# Patient Record
Sex: Female | Born: 1940 | Race: Black or African American | Hispanic: No | Marital: Single | Smoking: Never smoker
Health system: Southern US, Community
[De-identification: ages and names within clinical notes are randomized; demographics above are authoritative.]

## PROBLEM LIST (undated history)

## (undated) DIAGNOSIS — I10 Essential (primary) hypertension: Secondary | ICD-10-CM

## (undated) HISTORY — PX: OTHER SURGICAL HISTORY: SHX169

---

## 2019-11-28 ENCOUNTER — Ambulatory Visit: Payer: Self-pay | Attending: Internal Medicine

## 2019-11-28 DIAGNOSIS — Z20822 Contact with and (suspected) exposure to covid-19: Secondary | ICD-10-CM | POA: Insufficient documentation

## 2019-11-29 LAB — NOVEL CORONAVIRUS, NAA: SARS-CoV-2, NAA: NOT DETECTED

## 2021-10-21 ENCOUNTER — Other Ambulatory Visit: Payer: Self-pay

## 2021-10-21 ENCOUNTER — Ambulatory Visit (HOSPITAL_COMMUNITY)
Admission: EM | Admit: 2021-10-21 | Discharge: 2021-10-21 | Disposition: A | Discharge status: Other Acute Inpatient Hospital | Payer: Medicare Other | Attending: Emergency Medicine | Admitting: Emergency Medicine

## 2021-10-21 ENCOUNTER — Encounter (HOSPITAL_COMMUNITY): Payer: Self-pay

## 2021-10-21 ENCOUNTER — Emergency Department (HOSPITAL_COMMUNITY)
Admission: EM | Admit: 2021-10-21 | Discharge: 2021-10-22 | Disposition: A | Discharge status: Home / Self Care | Payer: Medicare Other | Attending: Emergency Medicine | Admitting: Emergency Medicine

## 2021-10-21 ENCOUNTER — Emergency Department (HOSPITAL_COMMUNITY)
Admission: EM | Admit: 2021-10-21 | Discharge: 2021-10-21 | Discharge status: Against Medical Advice | Payer: Medicare Other

## 2021-10-21 DIAGNOSIS — R195 Other fecal abnormalities: Secondary | ICD-10-CM | POA: Diagnosis not present

## 2021-10-21 DIAGNOSIS — Z20822 Contact with and (suspected) exposure to covid-19: Secondary | ICD-10-CM | POA: Insufficient documentation

## 2021-10-21 DIAGNOSIS — R112 Nausea with vomiting, unspecified: Secondary | ICD-10-CM | POA: Insufficient documentation

## 2021-10-21 DIAGNOSIS — R109 Unspecified abdominal pain: Secondary | ICD-10-CM | POA: Insufficient documentation

## 2021-10-21 DIAGNOSIS — R000 Tachycardia, unspecified: Secondary | ICD-10-CM | POA: Diagnosis not present

## 2021-10-21 DIAGNOSIS — K920 Hematemesis: Secondary | ICD-10-CM

## 2021-10-21 DIAGNOSIS — R1084 Generalized abdominal pain: Secondary | ICD-10-CM

## 2021-10-21 DIAGNOSIS — I10 Essential (primary) hypertension: Secondary | ICD-10-CM

## 2021-10-21 LAB — COMPREHENSIVE METABOLIC PANEL
ALT: 13 U/L (ref 0–44)
AST: 15 U/L (ref 15–41)
Albumin: 4.1 g/dL (ref 3.5–5.0)
Alkaline Phosphatase: 54 U/L (ref 38–126)
Anion gap: 9 (ref 5–15)
BUN: 13 mg/dL (ref 8–23)
CO2: 27 mmol/L (ref 22–32)
Calcium: 9.7 mg/dL (ref 8.9–10.3)
Chloride: 105 mmol/L (ref 98–111)
Creatinine, Ser: 0.97 mg/dL (ref 0.44–1.00)
GFR, Estimated: 59 mL/min — ABNORMAL LOW (ref >60)
Glucose, Bld: 109 mg/dL — ABNORMAL HIGH (ref 70–99)
Potassium: 4.4 mmol/L (ref 3.5–5.1)
Sodium: 141 mmol/L (ref 135–145)
Total Bilirubin: 0.6 mg/dL (ref 0.3–1.2)
Total Protein: 7.8 g/dL (ref 6.5–8.1)

## 2021-10-21 LAB — URINALYSIS, MICROSCOPIC (REFLEX)

## 2021-10-21 LAB — URINALYSIS, ROUTINE W REFLEX MICROSCOPIC
Bilirubin Urine: NEGATIVE
Glucose, UA: NEGATIVE mg/dL
Hgb urine dipstick: NEGATIVE
Ketones, ur: NEGATIVE mg/dL
Nitrite: NEGATIVE
Protein, ur: 30 mg/dL — AB
Specific Gravity, Urine: 1.02 (ref 1.005–1.030)
pH: 8 (ref 5.0–8.0)

## 2021-10-21 LAB — CBC WITH DIFFERENTIAL/PLATELET
Abs Immature Granulocytes: 0.03 10*3/uL (ref 0.00–0.07)
Basophils Absolute: 0 10*3/uL (ref 0.0–0.1)
Basophils Relative: 0 %
Eosinophils Absolute: 0.1 10*3/uL (ref 0.0–0.5)
Eosinophils Relative: 1 %
HCT: 40.3 % (ref 36.0–46.0)
Hemoglobin: 12.8 g/dL (ref 12.0–15.0)
Immature Granulocytes: 0 %
Lymphocytes Relative: 21 %
Lymphs Abs: 1.9 10*3/uL (ref 0.7–4.0)
MCH: 28.4 pg (ref 26.0–34.0)
MCHC: 31.8 g/dL (ref 30.0–36.0)
MCV: 89.6 fL (ref 80.0–100.0)
Monocytes Absolute: 0.5 10*3/uL (ref 0.1–1.0)
Monocytes Relative: 6 %
Neutro Abs: 6.5 10*3/uL (ref 1.7–7.7)
Neutrophils Relative %: 72 %
Platelets: 275 10*3/uL (ref 150–400)
RBC: 4.5 MIL/uL (ref 3.87–5.11)
RDW: 13.3 % (ref 11.5–15.5)
WBC: 9.1 10*3/uL (ref 4.0–10.5)
nRBC: 0 % (ref 0.0–0.2)

## 2021-10-21 LAB — LIPASE, BLOOD: Lipase: 35 U/L (ref 11–51)

## 2021-10-21 MED ORDER — ONDANSETRON 4 MG PO TBDP: 4.0000 mg | ORAL_TABLET | Freq: Once | ORAL | Status: DC

## 2021-10-21 NOTE — ED Provider Notes (Signed)
HPI  SUBJECTIVE:  Angelica Cook is a 81 y.o. female who presents with 1 week of nausea, vomiting every time she eats.  She started vomiting blood last night. She reports 1 episode last night, however, is vomiting darkish brown-reddish material here.  She states that she has not been able to tolerate any p.o. since last night.  She reports headache, and diffuse abdominal burning pain.  Denies chest pain, pressure, heaviness, shortness of breath, abdominal distention.  She has never had symptoms like this before.  She has tried Tums without improvement in her symptoms.  There are no aggravating or alleviating factors.  She has a past medical history of hypertension, ran out of her medications 1 month ago, coronary disease, status post CABG in 2015.  History of MI, esophageal varices, diabetes, alcohol abuse, she is not on any anticoagulants or antiplatelets.  No past medical history on file.   No family history on file.  Social History   Tobacco Use   Smoking status: Never   Smokeless tobacco: Never  Substance Use Topics   Alcohol use: Never   Drug use: Never     Current Facility-Administered Medications:    ondansetron (ZOFRAN-ODT) disintegrating tablet 4 mg, 4 mg, Oral, Once, Domenick Gong, MD No current outpatient medications on file.  No Known Allergies   ROS  As noted in HPI.   Physical Exam  BP (!) 151/96    Pulse 99    Temp 98.4 F (36.9 C) (Oral)    Resp 16    SpO2 99%   Blood pressure 188/124 heart rate 101 on my exam  Constitutional: Well developed, well nourished, appears ill, violently vomiting brownish red material Eyes:  EOMI, conjunctiva normal bilaterally HENT: Normocephalic, atraumatic,mucus membranes moist Respiratory: Normal inspiratory effort, rales left lower lobe Cardiovascular: Regular tachycardia, no murmurs, rubs, gallop GI: nondistended, soft, positive periumbilical and right upper quadrant tenderness.  No guarding, rebound. skin: No rash,  skin intact Musculoskeletal: no deformities Neurologic: Alert & oriented x 3, no focal neuro deficits Psychiatric: Speech and behavior appropriate   ED Course   Medications  ondansetron (ZOFRAN-ODT) disintegrating tablet 4 mg (has no administration in time range)    Orders Placed This Encounter  Procedures   ED EKG    Standing Status:   Standing    Number of Occurrences:   1    Order Specific Question:   Reason for Exam    Answer:   Weakness   Insert peripheral IV    Standing Status:   Standing    Number of Occurrences:   1    No results found for this or any previous visit (from the past 24 hour(s)). No results found.  ED Clinical Impression  1. Hematemesis with nausea   2. Generalized abdominal pain   3. Hypertension, unspecified type      ED Assessment/Plan  Checking EKG.  Patient is complaining of headache, has a markedly elevated blood pressure, borderline tachycardia and hematemesis.  Transferring to the emergency department for further evaluation via EMS in case she deteriorates on the way down.  Placing IV.  Discussed rationale for transfer to the emergency department with patient and with family member.  They agree with plan.  EKG: Sinus tachycardia, rate 105.  Normal axis, normal intervals.  No hypertrophy.  No ST elevation.  No previous EKG for comparison.  Gave report to EMS.  Meds ordered this encounter  Medications   ondansetron (ZOFRAN-ODT) disintegrating tablet 4 mg      *  This clinic note was created using Scientist, clinical (histocompatibility and immunogenetics). Therefore, there may be occasional mistakes despite careful proofreading.  ?    Domenick Gong, MD 10/21/21 [phone]

## 2021-10-21 NOTE — ED Provider Notes (Signed)
Emergency Medicine Provider Triage Evaluation Note  Angelica Cook , a 81 y.o. female  was evaluated in triage.  Pt complains of Pt complains of vomiting blood.  Had onset of nausea and vomiting starting Monday.  She had multiple episodes of vomiting.  Today she noticed some vomit that was streaked with blood.  She went to the urgent care and was sent here.  She has no abdominal pain at this time.  Patient is not on any blood thinners .  Review of Systems  Positive: HEMATEMESIS Negative: ABD PAIN  Physical Exam  There were no vitals taken for this visit. Gen:   Awake, no distress   Resp:  Normal effort  MSK:   Moves extremities without difficulty  Other:  ABD NON-TENDER  Medical Decision Making  Medically screening exam initiated at 4:59 PM.  Appropriate orders placed.  Hinton Dyer was informed that the remainder of the evaluation will be completed by another provider, this initial triage assessment does not replace that evaluation, and the importance of remaining in the ED until their evaluation is complete.  Work-up initiated   Margarita Mail, PA-C 10/21/21 [place], [place], MD 10/22/21 [phone]

## 2021-10-21 NOTE — ED Triage Notes (Signed)
Patient arrived from [facility] for further evaluation of abdominal pain with nausea and vomiting. States that she had 1 episode of vomiting coffee ground emesis. On arrival alert and oriented,

## 2021-10-21 NOTE — ED Triage Notes (Signed)
Pt presents with c/o vomiting, headache and states there is blood in her vomit x 1 week.   Pt c/o heart burn.

## 2021-10-21 NOTE — ED Notes (Signed)
Pt called multiple times no answer 

## 2021-10-22 LAB — CBC
HCT: 39.3 % (ref 36.0–46.0)
Hemoglobin: 12.5 g/dL (ref 12.0–15.0)
MCH: 28.3 pg (ref 26.0–34.0)
MCHC: 31.8 g/dL (ref 30.0–36.0)
MCV: 89.1 fL (ref 80.0–100.0)
Platelets: 253 10*3/uL (ref 150–400)
RBC: 4.41 MIL/uL (ref 3.87–5.11)
RDW: 13.2 % (ref 11.5–15.5)
WBC: 7 10*3/uL (ref 4.0–10.5)
nRBC: 0 % (ref 0.0–0.2)

## 2021-10-22 LAB — RESP PANEL BY RT-PCR (FLU A&B, COVID) ARPGX2
Influenza A by PCR: NEGATIVE
Influenza B by PCR: NEGATIVE
SARS Coronavirus 2 by RT PCR: NEGATIVE

## 2021-10-22 LAB — POC OCCULT BLOOD, ED: Fecal Occult Bld: NEGATIVE

## 2021-10-22 LAB — CBG MONITORING, ED: Glucose-Capillary: 98 mg/dL (ref 70–99)

## 2021-10-22 MED ORDER — OMEPRAZOLE 20 MG PO CPDR: 20.0000 mg | DELAYED_RELEASE_CAPSULE | Freq: Every day | ORAL | 0 refills | Status: AC

## 2021-10-22 MED ORDER — PANTOPRAZOLE SODIUM 40 MG IV SOLR: 40.0000 mg | Freq: Once | INTRAVENOUS | Status: DC

## 2021-10-22 MED ORDER — ONDANSETRON 4 MG PO TBDP: 4.0000 mg | ORAL_TABLET | ORAL | 0 refills | Status: DC | PRN

## 2021-10-22 MED ORDER — OMEPRAZOLE 20 MG PO CPDR: 20.0000 mg | DELAYED_RELEASE_CAPSULE | Freq: Every day | ORAL | 0 refills | Status: DC

## 2021-10-22 MED ORDER — LACTATED RINGERS IV BOLUS: 500.0000 mL | Freq: Once | INTRAVENOUS | Status: DC

## 2021-10-22 NOTE — ED Provider Notes (Signed)
[facility] EMERGENCY DEPARTMENT Provider Note   CSN: [id number] Arrival date & time: 10/21/21  1625     History  No chief complaint on file.   Angelica Cook is a 81 y.o. female.  HPI Patient is sent from urgent care for concern of vomiting with dark or bloody appearing material.  Patient is had nausea for about a week.  She has vomited frequently after eating.  Patient denies she has had any diarrhea or constipation.  No dark or bloody looking bowel movement.  Patient is currently denying any abdominal pain.  At urgent care report is for diffuse burning abdominal pain.  Patient denies prior history of similar episode.  She denies known history of GI bleed.  She is not anticoagulated.  Patient denies lightheadedness or near syncope.  Patient reports last episode of emesis was at 1030 last night.  It was not bloody in appearance at that time.  Patient does report overnight (patient had very prolonged ED wait time) she drank some water and ate some potato chips without vomiting.  This did not elicit abdominal pain either.    Home Medications Prior to Admission medications   Medication Sig Start Date End Date Taking? Authorizing Provider  omeprazole (PRILOSEC) 20 MG capsule Take 1 capsule (20 mg total) by mouth daily. 10/22/21  Yes Arby Barrette, MD  ondansetron (ZOFRAN-ODT) 4 MG disintegrating tablet Take 1 tablet (4 mg total) by mouth every 4 (four) hours as needed for nausea or vomiting. 10/22/21  Yes Arby Barrette, MD      Allergies    Patient has no known allergies.    Review of Systems   Review of Systems 10 systems reviewed negative except as per HPI Physical Exam Updated Vital Signs BP (!) 138/96    Pulse 90    Temp 98.2 F (36.8 C) (Oral)    Resp 16    SpO2 99%  Physical Exam Constitutional:      Comments: Alert nontoxic.  Sitting on the edge of the stretcher without acute distress.  HENT:     Mouth/Throat:     Mouth: Mucous membranes are dry.  Eyes:      Extraocular Movements: Extraocular movements intact.  Cardiovascular:     Rate and Rhythm: Normal rate and regular rhythm.  Pulmonary:     Effort: Pulmonary effort is normal.     Breath sounds: Normal breath sounds.  Abdominal:     General: There is no distension.     Palpations: Abdomen is soft.     Tenderness: There is no abdominal tenderness. There is no guarding.  Genitourinary:    Comments: No melena or visible blood on rectal exam.  Trace amount of brown stool Musculoskeletal:        General: No swelling or tenderness.     Right lower leg: No edema.     Left lower leg: No edema.  Skin:    General: Skin is warm and dry.  Neurological:     General: No focal deficit present.     Mental Status: She is oriented to person, place, and time.     Coordination: Coordination normal.  Psychiatric:        Mood and Affect: Mood normal.    ED Results / Procedures / Treatments   Labs (all labs ordered are listed, but only abnormal results are displayed) Labs Reviewed  COMPREHENSIVE METABOLIC PANEL - Abnormal; Notable for the following components:      Result Value  Glucose, Bld 109 (*)    GFR, Estimated 59 (*)    All other components within normal limits  URINALYSIS, ROUTINE W REFLEX MICROSCOPIC - Abnormal; Notable for the following components:   Protein, ur 30 (*)    Leukocytes,Ua TRACE (*)    All other components within normal limits  URINALYSIS, MICROSCOPIC (REFLEX) - Abnormal; Notable for the following components:   Bacteria, UA MANY (*)    All other components within normal limits  RESP PANEL BY RT-PCR (FLU A&B, COVID) ARPGX2  LIPASE, BLOOD  CBC WITH DIFFERENTIAL/PLATELET  CBC  POC OCCULT BLOOD, ED  CBG MONITORING, ED    EKG None  Radiology No results found.  Procedures Procedures    Medications Ordered in ED Medications  ondansetron (ZOFRAN-ODT) disintegrating tablet 4 mg (has no administration in time range)  lactated ringers bolus 500 mL (has no  administration in time range)  pantoprazole (PROTONIX) injection 40 mg (has no administration in time range)    ED Course/ Medical Decision Making/ A&P                           Medical Decision Making  Patient presents as outlined with report of dark or bloody looking emesis.  Patient is not anticoagulated.  At urgent care she was tachycardic and referred to emergency department for further evaluation for suspected GI bleed.  Hemoglobin normal.  Blood pressure stable.  At this time with prolonged wait time, will repeat CBC to determine if any change in hemoglobin.  We will proceed with rehydration for report of frequent vomiting.  At this time, patient does not endorse any abdominal pain and abdominal exam is soft nontender.  Currently do not anticipate proceeding with CT imaging.  Patient presents with report of vomiting.  No vomiting since last night.  Patient has been tolerating oral intake overnight. clinically patient is well in appearance.  Clinical exam does not suggest dehydration or symptomatic anemia.  Labs reviewed by myself.  CBC repeated after greater than 6 hours and stable at 12.8 and 12.5 respectively.  Patient is not positive for orthostatic changes.  Stool is not melanotic or bloody in appearance.  At this time, plan will be for initiating a PPI and Zofran if needed.  Close follow-up with PCP and possible referral to GI if any persistent or recurrent symptoms.  Return precautions included in discharge instructions.  Currently patient is stable for discharge. Final Clinical Impression(s) / ED Diagnoses Final diagnoses:  Nausea and vomiting, unspecified vomiting type    Rx / DC Orders ED Discharge Orders          Ordered    omeprazole (PRILOSEC) 20 MG capsule  Daily        10/22/21 0943    ondansetron (ZOFRAN-ODT) 4 MG disintegrating tablet  Every 4 hours PRN        10/22/21 0943              Charlesetta Shanks, MD 10/22/21 [phone]

## 2021-10-22 NOTE — Discharge Instructions (Signed)
1.  Schedule follow-up appointment with your doctor within the next 2 to 5 days.  Return to the emergency department if you have recurrence of vomiting with any bloody appearing material.  Return if you feel short of breath, weak like he will pass out or other concerning symptoms. 2.  Start omeprazole daily as prescribed.  Take this 30 minutes in the morning before you eat anything.  You have also been given Zofran to take for nausea if needed. 3.  You may need referral for an upper endoscopy.  This is a procedure where a lighted scope is used to examine the inside of your esophagus and stomach for any source of bleeding.  Reviewed this with your doctor for referral if needed.

## 2021-12-26 ENCOUNTER — Emergency Department (HOSPITAL_COMMUNITY): Payer: Medicare Other

## 2021-12-26 ENCOUNTER — Other Ambulatory Visit: Payer: Self-pay

## 2021-12-26 ENCOUNTER — Emergency Department (HOSPITAL_COMMUNITY)
Admission: EM | Admit: 2021-12-26 | Discharge: 2021-12-27 | Disposition: A | Discharge status: Home / Self Care | Payer: Medicare Other | Attending: Emergency Medicine | Admitting: Emergency Medicine

## 2021-12-26 ENCOUNTER — Encounter (HOSPITAL_COMMUNITY): Payer: Self-pay | Admitting: *Deleted

## 2021-12-26 DIAGNOSIS — R12 Heartburn: Secondary | ICD-10-CM | POA: Diagnosis not present

## 2021-12-26 DIAGNOSIS — I700 Atherosclerosis of aorta: Secondary | ICD-10-CM | POA: Diagnosis not present

## 2021-12-26 DIAGNOSIS — K449 Diaphragmatic hernia without obstruction or gangrene: Secondary | ICD-10-CM

## 2021-12-26 DIAGNOSIS — R112 Nausea with vomiting, unspecified: Secondary | ICD-10-CM | POA: Diagnosis not present

## 2021-12-26 DIAGNOSIS — R109 Unspecified abdominal pain: Secondary | ICD-10-CM | POA: Diagnosis not present

## 2021-12-26 DIAGNOSIS — I2510 Atherosclerotic heart disease of native coronary artery without angina pectoris: Secondary | ICD-10-CM | POA: Diagnosis not present

## 2021-12-26 DIAGNOSIS — R0689 Other abnormalities of breathing: Secondary | ICD-10-CM | POA: Insufficient documentation

## 2021-12-26 DIAGNOSIS — I25810 Atherosclerosis of coronary artery bypass graft(s) without angina pectoris: Secondary | ICD-10-CM | POA: Diagnosis not present

## 2021-12-26 DIAGNOSIS — I10 Essential (primary) hypertension: Secondary | ICD-10-CM | POA: Diagnosis not present

## 2021-12-26 DIAGNOSIS — R000 Tachycardia, unspecified: Secondary | ICD-10-CM | POA: Insufficient documentation

## 2021-12-26 DIAGNOSIS — R198 Other specified symptoms and signs involving the digestive system and abdomen: Secondary | ICD-10-CM | POA: Diagnosis not present

## 2021-12-26 DIAGNOSIS — R0989 Other specified symptoms and signs involving the circulatory and respiratory systems: Secondary | ICD-10-CM | POA: Diagnosis not present

## 2021-12-26 DIAGNOSIS — R7989 Other specified abnormal findings of blood chemistry: Secondary | ICD-10-CM | POA: Diagnosis not present

## 2021-12-26 DIAGNOSIS — K219 Gastro-esophageal reflux disease without esophagitis: Secondary | ICD-10-CM

## 2021-12-26 DIAGNOSIS — E785 Hyperlipidemia, unspecified: Secondary | ICD-10-CM | POA: Diagnosis not present

## 2021-12-26 HISTORY — DX: Essential (primary) hypertension: I10

## 2021-12-26 LAB — COMPREHENSIVE METABOLIC PANEL
ALT: 14 U/L (ref 0–44)
AST: 24 U/L (ref 15–41)
Albumin: 3.9 g/dL (ref 3.5–5.0)
Alkaline Phosphatase: 56 U/L (ref 38–126)
Anion gap: 11 (ref 5–15)
BUN: 16 mg/dL (ref 8–23)
CO2: 23 mmol/L (ref 22–32)
Calcium: 9.4 mg/dL (ref 8.9–10.3)
Chloride: 107 mmol/L (ref 98–111)
Creatinine, Ser: 1.11 mg/dL — ABNORMAL HIGH (ref 0.44–1.00)
GFR, Estimated: 50 mL/min — ABNORMAL LOW (ref >60)
Glucose, Bld: 108 mg/dL — ABNORMAL HIGH (ref 70–99)
Potassium: 4.6 mmol/L (ref 3.5–5.1)
Sodium: 141 mmol/L (ref 135–145)
Total Bilirubin: 1.1 mg/dL (ref 0.3–1.2)
Total Protein: 8 g/dL (ref 6.5–8.1)

## 2021-12-26 LAB — CBC
HCT: 38.7 % (ref 36.0–46.0)
Hemoglobin: 12.5 g/dL (ref 12.0–15.0)
MCH: 28.3 pg (ref 26.0–34.0)
MCHC: 32.3 g/dL (ref 30.0–36.0)
MCV: 87.8 fL (ref 80.0–100.0)
Platelets: 224 10*3/uL (ref 150–400)
RBC: 4.41 MIL/uL (ref 3.87–5.11)
RDW: 13.2 % (ref 11.5–15.5)
WBC: 5.9 10*3/uL (ref 4.0–10.5)
nRBC: 0 % (ref 0.0–0.2)

## 2021-12-26 LAB — URINALYSIS, ROUTINE W REFLEX MICROSCOPIC
Bilirubin Urine: NEGATIVE
Glucose, UA: NEGATIVE mg/dL
Hgb urine dipstick: NEGATIVE
Ketones, ur: NEGATIVE mg/dL
Leukocytes,Ua: NEGATIVE
Nitrite: NEGATIVE
Protein, ur: NEGATIVE mg/dL
Specific Gravity, Urine: 1.019 (ref 1.005–1.030)
pH: 8 (ref 5.0–8.0)

## 2021-12-26 LAB — RAPID URINE DRUG SCREEN, HOSP PERFORMED
Amphetamines: NOT DETECTED
Barbiturates: NOT DETECTED
Benzodiazepines: NOT DETECTED
Cocaine: NOT DETECTED
Opiates: NOT DETECTED
Tetrahydrocannabinol: NOT DETECTED

## 2021-12-26 LAB — D-DIMER, QUANTITATIVE: D-Dimer, Quant: 2.01 ug/mL-FEU — ABNORMAL HIGH (ref 0.00–0.50)

## 2021-12-26 LAB — TROPONIN I (HIGH SENSITIVITY): Troponin I (High Sensitivity): 6 ng/L (ref <18)

## 2021-12-26 LAB — LIPASE, BLOOD: Lipase: 41 U/L (ref 11–51)

## 2021-12-26 LAB — TSH: TSH: 0.95 u[IU]/mL (ref 0.350–4.500)

## 2021-12-26 MED ORDER — PANTOPRAZOLE SODIUM 40 MG IV SOLR
40.0000 mg | Freq: Once | INTRAVENOUS | Status: AC
  Administered 2021-12-27: 40 mg via INTRAVENOUS
  Filled 2021-12-26: qty 10

## 2021-12-26 MED ORDER — ONDANSETRON HCL 4 MG/2ML IJ SOLN
4.0000 mg | Freq: Once | INTRAMUSCULAR | Status: AC
Start: 2021-12-26 — End: 2021-12-26
  Administered 2021-12-26: 4 mg via INTRAVENOUS
  Filled 2021-12-26: qty 2

## 2021-12-26 MED ORDER — SODIUM CHLORIDE 0.9 % IV BOLUS
500.0000 mL | Freq: Once | INTRAVENOUS | Status: AC
  Administered 2021-12-26: 500 mL via INTRAVENOUS

## 2021-12-26 MED ORDER — ONDANSETRON 4 MG PO TBDP
4.0000 mg | ORAL_TABLET | Freq: Three times a day (TID) | ORAL | 0 refills | Status: AC | PRN
Start: 2021-12-26 — End: ?

## 2021-12-26 MED ORDER — IOHEXOL 300 MG/ML  SOLN
100.0000 mL | Freq: Once | INTRAMUSCULAR | Status: AC | PRN
  Administered 2021-12-26: 100 mL via INTRAVENOUS

## 2021-12-26 NOTE — ED Provider Notes (Addendum)
[facility] EMERGENCY DEPARTMENT Provider Note   CSN: [id number] Arrival date & time: 12/26/21  1640     History  Chief Complaint  Patient presents with   Abdominal Pain    Angelica Cook is a 81 y.o. female.  Presents chief complaint abdominal pain nausea and vomiting.  She had just seen her doctor and left her on the way home she started vomiting so decided come to the ER for evaluation.  Describes the vomitus is nonbloody.  No associated diarrhea no fevers no cough no chest pain.  She states currently the pain is improved and nearly gone.      Home Medications Prior to Admission medications   Medication Sig Start Date End Date Taking? Authorizing Provider  ondansetron (ZOFRAN-ODT) 4 MG disintegrating tablet Take 1 tablet (4 mg total) by mouth every 8 (eight) hours as needed for up to 10 doses for nausea or vomiting. 12/26/21  Yes China, Eustace Moore, MD  omeprazole (PRILOSEC) 20 MG capsule Take 1 capsule (20 mg total) by mouth daily. 10/22/21   Arby Barrette, MD      Allergies    Patient has no known allergies.    Review of Systems   Review of Systems  Constitutional:  Negative for fever.  HENT:  Negative for ear pain.   Eyes:  Negative for pain.  Respiratory:  Negative for cough.   Cardiovascular:  Negative for chest pain.  Gastrointestinal:  Positive for abdominal pain.  Genitourinary:  Negative for flank pain.  Musculoskeletal:  Negative for back pain.  Skin:  Negative for rash.  Neurological:  Negative for headaches.   Physical Exam Updated Vital Signs BP (!) 155/90    Pulse (!) 119    Temp 98.7 F (37.1 C) (Oral)    Resp 20    Ht 5\' 4"  (1.626 m)    Wt 54.4 kg    SpO2 100%    BMI 20.60 kg/m  Physical Exam Constitutional:      General: She is not in acute distress.    Appearance: Normal appearance.  HENT:     Head: Normocephalic.     Nose: Nose normal.  Eyes:     Extraocular Movements: Extraocular movements intact.  Cardiovascular:     Rate  and Rhythm: Normal rate.  Pulmonary:     Effort: Pulmonary effort is normal.  Abdominal:     Tenderness: There is abdominal tenderness in the epigastric area and periumbilical area.     Comments: Mild tenderness in the epigastric/periumbilical region.  No guarding or rebound noted.  Musculoskeletal:        General: Normal range of motion.     Cervical back: Normal range of motion.  Neurological:     General: No focal deficit present.     Mental Status: She is alert. Mental status is at baseline.    ED Results / Procedures / Treatments   Labs (all labs ordered are listed, but only abnormal results are displayed) Labs Reviewed  COMPREHENSIVE METABOLIC PANEL - Abnormal; Notable for the following components:      Result Value   Glucose, Bld 108 (*)    Creatinine, Ser 1.11 (*)    GFR, Estimated 50 (*)    All other components within normal limits  URINALYSIS, ROUTINE W REFLEX MICROSCOPIC - Abnormal; Notable for the following components:   APPearance HAZY (*)    All other components within normal limits  D-DIMER, QUANTITATIVE - Abnormal; Notable for the following components:  D-Dimer, Quant 2.01 (*)    All other components within normal limits  LIPASE, BLOOD  CBC  TSH  RAPID URINE DRUG SCREEN, HOSP PERFORMED  TROPONIN I (HIGH SENSITIVITY)    EKG EKG Interpretation  Date/Time:  Friday December 26 2021 21:52:09 EST Ventricular Rate:  121 PR Interval:  158 QRS Duration: 78 QT Interval:  263 QTC Calculation: 373 R Axis:   64 Text Interpretation: Sinus tachycardia Nonspecific repol abnormality, diffuse leads Baseline wander in lead(s) V3 Confirmed by Norman ClayHong, Axle Parfait ([id number]) on 12/26/2021 10:20:53 PM  Radiology CT Abdomen Pelvis W Contrast  Result Date: 12/26/2021 CLINICAL DATA:  Acute nonlocalized abdominal pain associated with nausea and vomiting since this morning. EXAM: CT ABDOMEN AND PELVIS WITH CONTRAST TECHNIQUE: Multidetector CT imaging of the abdomen and pelvis was performed  using the standard protocol following bolus administration of intravenous contrast. RADIATION DOSE REDUCTION: This exam was performed according to the departmental dose-optimization program which includes automated exposure control, adjustment of the mA and/or kV according to patient size and/or use of iterative reconstruction technique. CONTRAST:  100mL OMNIPAQUE IOHEXOL 300 MG/ML  SOLN COMPARISON:  None available FINDINGS: Lower chest: Lung bases are unremarkable. There are dense coronary artery calcifications and aortic root calcifications. Hepatobiliary: Smooth liver contours. There is diffuse decreased density throughout the liver suggesting fatty infiltration. No focal liver mass is identified. There is a 4 mm calcific density layering within the gallbladder likely a tiny gallstone. No gallbladder wall thickening is seen. No intrahepatic or extrahepatic biliary ductal dilatation. Pancreas: No mass or inflammatory fat stranding. No pancreatic ductal dilatation is seen. Spleen: Normal in size without focal abnormality. Adrenals/Urinary Tract: There is a fluid density cyst measuring up to 2.7 cm extending partially exophytically from the inferior aspect of the interpolar right kidney. The kidneys enhance uniformly and are symmetric in size without hydronephrosis. No renal stone is seen. No renal mass is seen. No focal urinary bladder wall thickening. Stomach/Bowel: Mild scattered descending and sigmoid colon diverticulosis without inflammatory changes to indicate acute diverticulitis. The terminal ileum is unremarkable. The appendix appears within normal limits (axial series 3 images 55 through 59, coronal images 45 through 66). No dilated loops of bowel to indicate bowel obstruction. There is a moderate sliding hiatal hernia. Vascular/Lymphatic: No abdominal aortic aneurysm. Moderate atherosclerotic calcifications. No mesenteric, retroperitoneal, or pelvic lymphadenopathy. Reproductive: The uterus is present.  There is a left adnexal cyst measuring up to 3.2 cm. Other: No abdominal wall hernia or abnormality.No abdominopelvic ascites. No pneumoperitoneum. Musculoskeletal: Status post median sternotomy. There is 10 mm grade 2 anterolisthesis of L5 on S1 with osseous fusion of the L5-S1 level. There is osseous fusion of the L5-S1 posterior elements. Moderate T10-11 disc space narrowing and peripheral osteophytosis. IMPRESSION: 1. Mild scattered distal colonic diverticulosis without inflammatory changes to indicate acute diverticulitis. 2. Moderate sliding hiatal hernia. 3. Tiny gallstone. No definite gallbladder wall inflammatory changes. 4. Grade 2 anterolisthesis of L5 on S1 with L5-S1 fusion. Electronically Signed   By: Neita Garnetonald  Viola M.D.   On: 12/26/2021 19:40   DG Chest Port 1 View  Result Date: 12/26/2021 CLINICAL DATA:  Tachycardia EXAM: PORTABLE CHEST 1 VIEW COMPARISON:  None. FINDINGS: Prior CABG. Heart and mediastinal contours are within normal limits. No focal opacities or effusions. No acute bony abnormality. Aortic atherosclerosis. IMPRESSION: No active cardiopulmonary disease. Electronically Signed   By: Charlett NoseKevin  Dover M.D.   On: 12/26/2021 22:10    Procedures Procedures    Medications Ordered in ED Medications  sodium chloride 0.9 % bolus 500 mL (0 mLs Intravenous Stopped 12/26/21 2211)  iohexol (OMNIPAQUE) 300 MG/ML solution 100 mL (100 mLs Intravenous Contrast Given 12/26/21 1909)  ondansetron (ZOFRAN) injection 4 mg (4 mg Intravenous Given 12/26/21 2018)  sodium chloride 0.9 % bolus 500 mL (500 mLs Intravenous New Bag/Given 12/26/21 2210)    ED Course/ Medical Decision Making/ A&P                           Medical Decision Making Amount and/or Complexity of Data Reviewed Labs: ordered. Radiology: ordered.  Risk Prescription drug management.   Chart review shows prior visits for nausea and vomiting in January 2023.  Patient kept on the cardiac monitor, sinus rhythm mild  tachycardia.  Labs are sent.  Chemistry within normal limits white count normal hemoglobin normal.  Urinalysis negative.  Lipase normal as well.  CT abdomen pelvis pursued with no acute findings per radiology.  Lateral hernia present which may be the cause of her symptoms.  Patient found to be persistently tachycardic, etiology unclear.  This is despite IV fluid resuscitation.  Additional labs sent at this time including troponin which is unremarkable and D-dimer which is elevated.  CT angio of the chest pursued.  Patient continues to have no chest pain or shortness of breath complaints however.  She does have distal episodes of vomiting here in the ER.     Final Clinical Impression(s) / ED Diagnoses Final diagnoses:  Abdominal pain, unspecified abdominal location  Nausea and vomiting, unspecified vomiting type    Rx / DC Orders ED Discharge Orders          Ordered    ondansetron (ZOFRAN-ODT) 4 MG disintegrating tablet  Every 8 hours PRN        12/26/21 2140              Cheryll Cockayne, MD 12/26/21 2141    Cheryll Cockayne, MD 12/26/21 2310

## 2021-12-26 NOTE — ED Triage Notes (Signed)
The pt is c/o abd pain since this am with n and vomiting  she just left her doctors office where she had an unltrsound and blood work  she was brought here after she vomited in the car on the way home ?

## 2021-12-26 NOTE — Discharge Instructions (Signed)
Call your primary care doctor or specialist as discussed in the next 2-3 days.   Return immediately back to the ER if:  Your symptoms worsen within the next 12-24 hours. You develop new symptoms such as new fevers, persistent vomiting, new pain, shortness of breath, or new weakness or numbness, or if you have any other concerns.  

## 2021-12-27 ENCOUNTER — Emergency Department (HOSPITAL_COMMUNITY): Payer: Medicare Other

## 2021-12-27 LAB — TROPONIN I (HIGH SENSITIVITY): Troponin I (High Sensitivity): 7 ng/L (ref <18)

## 2021-12-27 MED ORDER — MAALOX MAX 400-400-40 MG/5ML PO SUSP: 10.0000 mL | Freq: Four times a day (QID) | ORAL | 0 refills | Status: AC | PRN

## 2021-12-27 MED ORDER — IOHEXOL 350 MG/ML SOLN
50.0000 mL | Freq: Once | INTRAVENOUS | Status: AC | PRN
  Administered 2021-12-27: 50 mL via INTRAVENOUS

## 2021-12-27 MED ORDER — LIDOCAINE VISCOUS HCL 2 % MT SOLN
10.0000 mL | Freq: Four times a day (QID) | OROMUCOSAL | 0 refills | Status: AC | PRN
Start: 2021-12-27 — End: ?

## 2021-12-27 MED ORDER — LIDOCAINE VISCOUS HCL 2 % MT SOLN
15.0000 mL | Freq: Once | OROMUCOSAL | Status: AC
  Administered 2021-12-27: 15 mL via ORAL
  Filled 2021-12-27: qty 15

## 2021-12-27 MED ORDER — ALUM & MAG HYDROXIDE-SIMETH 200-200-20 MG/5ML PO SUSP
30.0000 mL | Freq: Once | ORAL | Status: AC
  Administered 2021-12-27: 30 mL via ORAL
  Filled 2021-12-27: qty 30

## 2021-12-27 MED ORDER — SODIUM CHLORIDE 0.9 % IV BOLUS
1000.0000 mL | Freq: Once | INTRAVENOUS | Status: AC
  Administered 2021-12-27: 1000 mL via INTRAVENOUS

## 2021-12-27 NOTE — ED Provider Notes (Signed)
I assumed care of this patient.  Please see previous provider note for further details of Hx, PE.  Briefly patient is a 81 y.o. female who presented with abdominal pain, nausea and vomiting noted to be tachycardic.  CT scan reassuring other than known hiatal hernia. ? ?Due to persistent tachycardia work-up was suspended including a D-dimer that was elevated.  Currently awaiting a CT PE study to rule out PE. ? ?CTA negative for PE or other acute process. ?Patient was treated with Protonix and GI cocktail.  Also given another liter of IV fluids. ? ?Patient's pain completely resolved.  Tolerated oral intake. ?Tachycardia resolved and patient maintained heart rates in the 90s.  She was monitored for several hours without recurrence of her tachycardia.  Ambulated without complication. ? ?The patient appears reasonably screened and/or stabilized for discharge and I doubt any other medical condition or other [facility] requiring further screening, evaluation, or treatment in the ED at this time prior to discharge. Safe for discharge with strict return precautions. ? ?Disposition: Discharge ? ?Condition: Good ? ?I have discussed the results, Dx and Tx plan with the patient/family who expressed understanding and agree(s) with the plan. Discharge instructions discussed at length. The patient/family was given strict return precautions who verbalized understanding of the instructions. No further questions at time of discharge.  ? ? ?ED Discharge Orders   ? ?      Ordered  ?  lidocaine (XYLOCAINE) 2 % solution  Every 6 hours PRN       ? 12/27/21 0424  ?  alum & mag hydroxide-simeth (MAALOX MAX) 400-400-40 MG/5ML suspension  Every 6 hours PRN       ? 12/27/21 0424  ?  ondansetron (ZOFRAN-ODT) 4 MG disintegrating tablet  Every 8 hours PRN       ? 12/26/21 2140  ? ?  ?  ? ?  ? ? ? ?Follow Up: ?[facility] EMERGENCY DEPARTMENT ?[address] ?[id number] ?[place] [address] ?[phone] ? ?As  needed, If symptoms worsen ? ?Your primary care doctor ? ?Schedule an appointment as soon as possible for a visit in 3 days ? ? ? ? ? ?  ?Nira Conn, MD ?12/27/21 [phone] ? ?

## 2021-12-27 NOTE — ED Notes (Signed)
Pt passed PO trial. Ambulated pt with pulse ox. Hr remained below 106 and SPO2 remained ablove 95%. Tolerated well with NAD NARD. Md updated ?

## 2022-01-12 DIAGNOSIS — R112 Nausea with vomiting, unspecified: Secondary | ICD-10-CM | POA: Diagnosis not present

## 2022-01-12 DIAGNOSIS — R058 Other specified cough: Secondary | ICD-10-CM | POA: Diagnosis not present

## 2022-02-06 DIAGNOSIS — R198 Other specified symptoms and signs involving the digestive system and abdomen: Secondary | ICD-10-CM | POA: Diagnosis not present

## 2022-02-06 DIAGNOSIS — N281 Cyst of kidney, acquired: Secondary | ICD-10-CM | POA: Diagnosis not present

## 2022-02-06 DIAGNOSIS — R12 Heartburn: Secondary | ICD-10-CM | POA: Diagnosis not present

## 2022-02-06 DIAGNOSIS — R0989 Other specified symptoms and signs involving the circulatory and respiratory systems: Secondary | ICD-10-CM | POA: Diagnosis not present

## 2022-02-06 DIAGNOSIS — R112 Nausea with vomiting, unspecified: Secondary | ICD-10-CM | POA: Diagnosis not present

## 2022-02-12 DIAGNOSIS — Z0000 Encounter for general adult medical examination without abnormal findings: Secondary | ICD-10-CM | POA: Diagnosis not present

## 2022-02-12 DIAGNOSIS — Z780 Asymptomatic menopausal state: Secondary | ICD-10-CM | POA: Diagnosis not present

## 2022-02-17 DIAGNOSIS — M8589 Other specified disorders of bone density and structure, multiple sites: Secondary | ICD-10-CM | POA: Diagnosis not present

## 2022-02-17 DIAGNOSIS — M85852 Other specified disorders of bone density and structure, left thigh: Secondary | ICD-10-CM | POA: Diagnosis not present

## 2022-03-05 ENCOUNTER — Other Ambulatory Visit (HOSPITAL_COMMUNITY): Payer: Self-pay | Admitting: Physician Assistant

## 2022-03-05 ENCOUNTER — Other Ambulatory Visit: Payer: Self-pay | Admitting: Physician Assistant

## 2022-03-05 DIAGNOSIS — K219 Gastro-esophageal reflux disease without esophagitis: Secondary | ICD-10-CM

## 2022-03-12 ENCOUNTER — Encounter (HOSPITAL_COMMUNITY): Payer: Self-pay

## 2022-03-12 ENCOUNTER — Inpatient Hospital Stay (HOSPITAL_COMMUNITY): Admission: RE | Admit: 2022-03-12 | Payer: Medicare Other | Source: Ambulatory Visit

## 2022-04-22 ENCOUNTER — Encounter (HOSPITAL_COMMUNITY): Payer: Self-pay

## 2022-04-22 ENCOUNTER — Inpatient Hospital Stay (HOSPITAL_COMMUNITY): Admission: RE | Admit: 2022-04-22 | Payer: Medicare Other | Source: Ambulatory Visit

## 2022-05-15 DIAGNOSIS — I10 Essential (primary) hypertension: Secondary | ICD-10-CM | POA: Diagnosis not present

## 2022-05-15 DIAGNOSIS — I25810 Atherosclerosis of coronary artery bypass graft(s) without angina pectoris: Secondary | ICD-10-CM | POA: Diagnosis not present

## 2022-05-15 DIAGNOSIS — E7800 Pure hypercholesterolemia, unspecified: Secondary | ICD-10-CM | POA: Diagnosis not present

## 2022-05-15 DIAGNOSIS — Z5181 Encounter for therapeutic drug level monitoring: Secondary | ICD-10-CM | POA: Diagnosis not present

## 2022-06-26 DIAGNOSIS — I10 Essential (primary) hypertension: Secondary | ICD-10-CM | POA: Diagnosis not present

## 2022-06-26 DIAGNOSIS — Z5181 Encounter for therapeutic drug level monitoring: Secondary | ICD-10-CM | POA: Diagnosis not present

## 2022-06-26 DIAGNOSIS — E7800 Pure hypercholesterolemia, unspecified: Secondary | ICD-10-CM | POA: Diagnosis not present

## 2022-08-05 DIAGNOSIS — E7800 Pure hypercholesterolemia, unspecified: Secondary | ICD-10-CM | POA: Diagnosis not present

## 2022-08-05 DIAGNOSIS — I10 Essential (primary) hypertension: Secondary | ICD-10-CM | POA: Diagnosis not present

## 2022-08-10 DIAGNOSIS — K920 Hematemesis: Secondary | ICD-10-CM | POA: Diagnosis not present

## 2022-08-13 DIAGNOSIS — Z1211 Encounter for screening for malignant neoplasm of colon: Secondary | ICD-10-CM | POA: Diagnosis not present

## 2022-08-13 DIAGNOSIS — D649 Anemia, unspecified: Secondary | ICD-10-CM | POA: Diagnosis not present

## 2022-08-13 DIAGNOSIS — I25810 Atherosclerosis of coronary artery bypass graft(s) without angina pectoris: Secondary | ICD-10-CM | POA: Diagnosis not present

## 2022-08-13 DIAGNOSIS — K920 Hematemesis: Secondary | ICD-10-CM | POA: Diagnosis not present

## 2022-08-13 DIAGNOSIS — K219 Gastro-esophageal reflux disease without esophagitis: Secondary | ICD-10-CM | POA: Diagnosis not present

## 2022-08-20 DIAGNOSIS — K2930 Chronic superficial gastritis without bleeding: Secondary | ICD-10-CM | POA: Diagnosis not present

## 2022-08-20 DIAGNOSIS — K920 Hematemesis: Secondary | ICD-10-CM | POA: Diagnosis not present

## 2022-08-20 DIAGNOSIS — K317 Polyp of stomach and duodenum: Secondary | ICD-10-CM | POA: Diagnosis not present

## 2022-08-20 DIAGNOSIS — K449 Diaphragmatic hernia without obstruction or gangrene: Secondary | ICD-10-CM | POA: Diagnosis not present

## 2022-08-20 DIAGNOSIS — K222 Esophageal obstruction: Secondary | ICD-10-CM | POA: Diagnosis not present

## 2022-08-24 DIAGNOSIS — K2930 Chronic superficial gastritis without bleeding: Secondary | ICD-10-CM | POA: Diagnosis not present

## 2022-09-28 DIAGNOSIS — R195 Other fecal abnormalities: Secondary | ICD-10-CM | POA: Diagnosis not present

## 2022-09-28 DIAGNOSIS — D649 Anemia, unspecified: Secondary | ICD-10-CM | POA: Diagnosis not present

## 2023-11-01 DIAGNOSIS — R262 Difficulty in walking, not elsewhere classified: Secondary | ICD-10-CM | POA: Diagnosis not present

## 2023-11-01 DIAGNOSIS — E785 Hyperlipidemia, unspecified: Secondary | ICD-10-CM | POA: Diagnosis not present

## 2023-11-01 DIAGNOSIS — I10 Essential (primary) hypertension: Secondary | ICD-10-CM | POA: Diagnosis not present

## 2023-11-01 DIAGNOSIS — R739 Hyperglycemia, unspecified: Secondary | ICD-10-CM | POA: Diagnosis not present

## 2023-11-01 DIAGNOSIS — Z0000 Encounter for general adult medical examination without abnormal findings: Secondary | ICD-10-CM | POA: Diagnosis not present

## 2023-11-01 DIAGNOSIS — Z23 Encounter for immunization: Secondary | ICD-10-CM | POA: Diagnosis not present

## 2023-11-01 DIAGNOSIS — N1831 Chronic kidney disease, stage 3a: Secondary | ICD-10-CM | POA: Diagnosis not present

## 2023-11-01 DIAGNOSIS — K219 Gastro-esophageal reflux disease without esophagitis: Secondary | ICD-10-CM | POA: Diagnosis not present

## 2023-11-01 DIAGNOSIS — I25810 Atherosclerosis of coronary artery bypass graft(s) without angina pectoris: Secondary | ICD-10-CM | POA: Diagnosis not present

## 2023-11-01 DIAGNOSIS — D509 Iron deficiency anemia, unspecified: Secondary | ICD-10-CM | POA: Diagnosis not present

## 2023-11-01 DIAGNOSIS — M8588 Other specified disorders of bone density and structure, other site: Secondary | ICD-10-CM | POA: Diagnosis not present

## 2023-11-15 DIAGNOSIS — R748 Abnormal levels of other serum enzymes: Secondary | ICD-10-CM | POA: Diagnosis not present

## 2023-11-15 DIAGNOSIS — E876 Hypokalemia: Secondary | ICD-10-CM | POA: Diagnosis not present

## 2023-11-27 IMAGING — CT CT ABD-PELV W/ CM
2 of 5 series · 15 of 46 positions shown, 17 images · IV contrast (APPLIED)
Comparison: None available

CLINICAL DATA: Acute nonlocalized abdominal pain associated with
nausea and vomiting since this morning.

EXAM:
CT ABDOMEN AND PELVIS WITH CONTRAST
TECHNIQUE: Multidetector CT imaging of the abdomen and pelvis was performed
using the standard protocol following bolus administration of
intravenous contrast.

[Series 3: abdomen 5.0 · axial · 0.80mm/px · z∈[+865,+1235]mm · 12 of 88 slices shown, 14 images]
[im 7/88  soft-tissue]
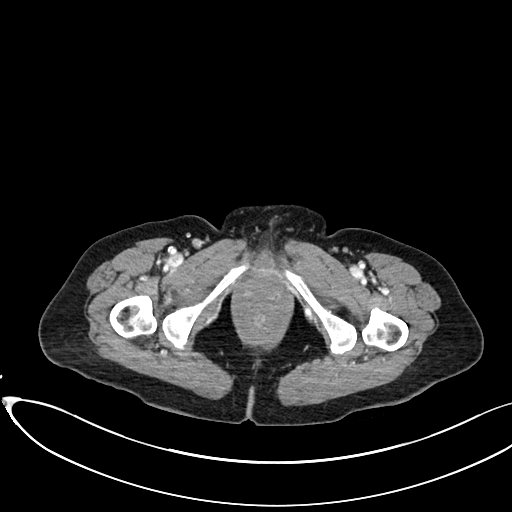
[im 7/88  bone]
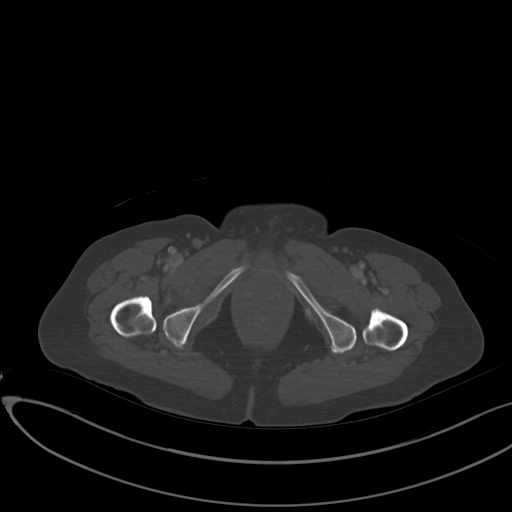
[im 14/88  soft-tissue]
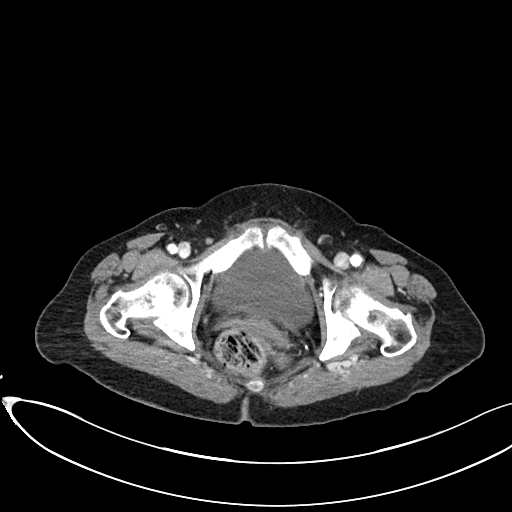
[im 21/88  soft-tissue]
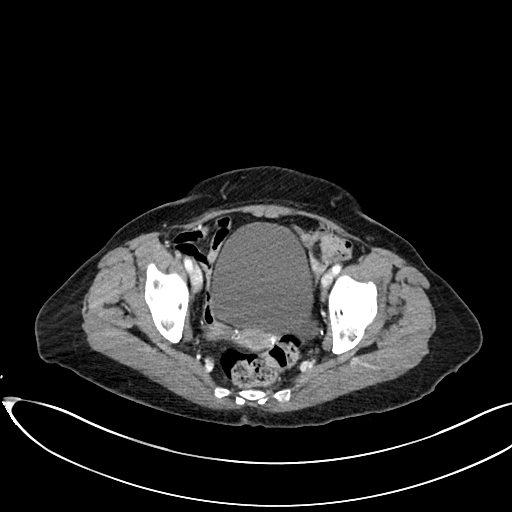
[im 27/88  soft-tissue]
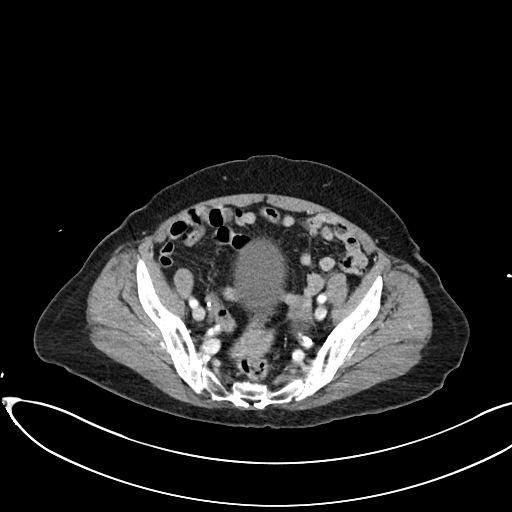
[im 34/88  soft-tissue]
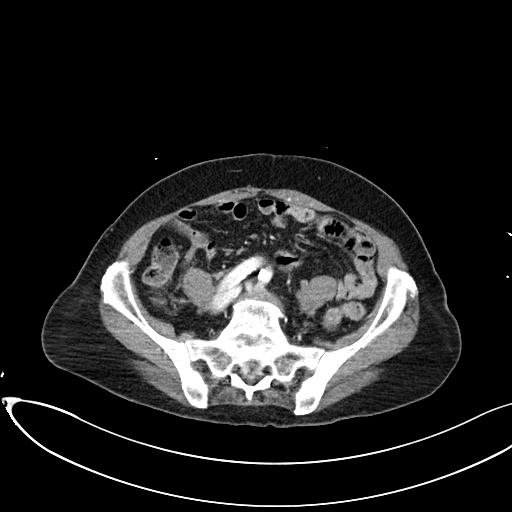
[im 41/88  soft-tissue]
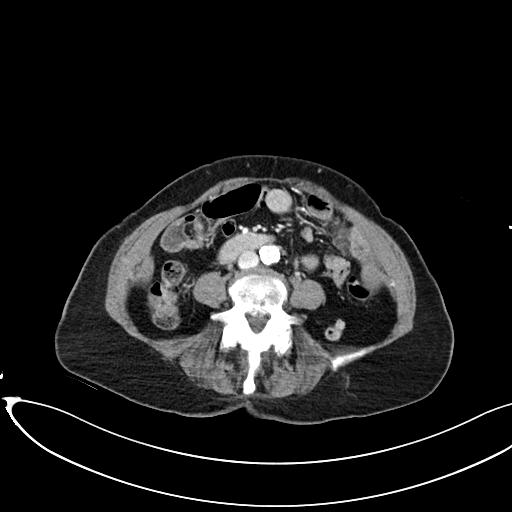
[im 47/88  soft-tissue]
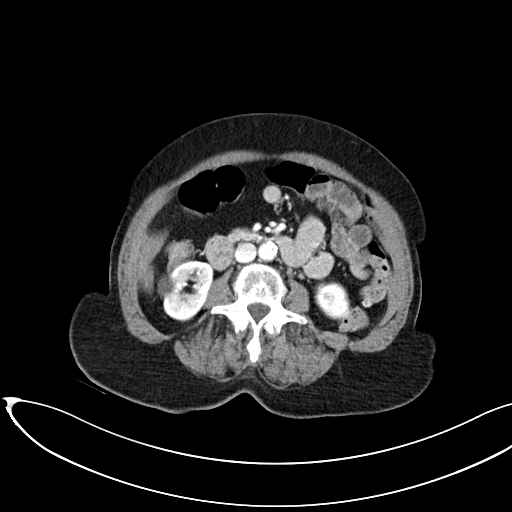
[im 54/88  soft-tissue]
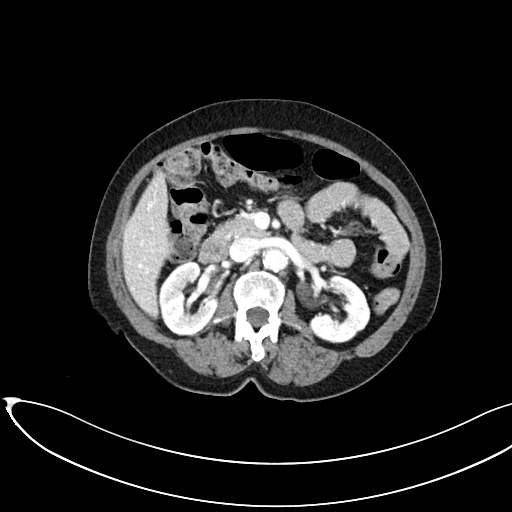
[im 61/88  soft-tissue]
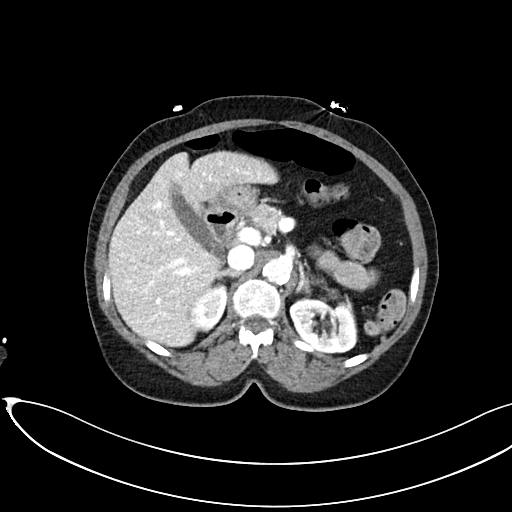
[im 61/88  bone]
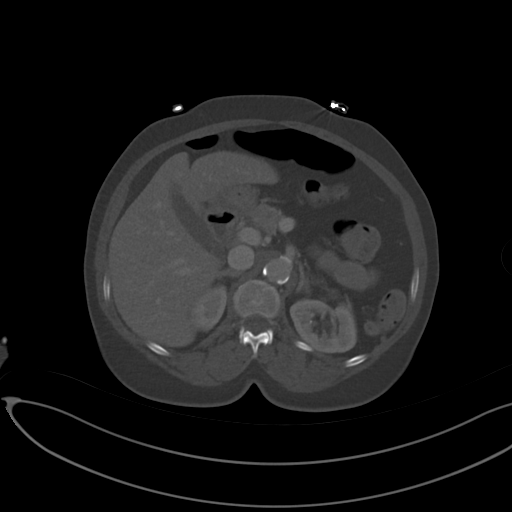
[im 67/88  soft-tissue]
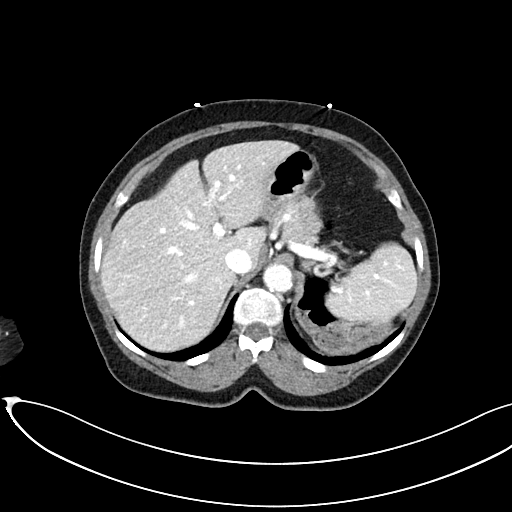
[im 74/88  soft-tissue]
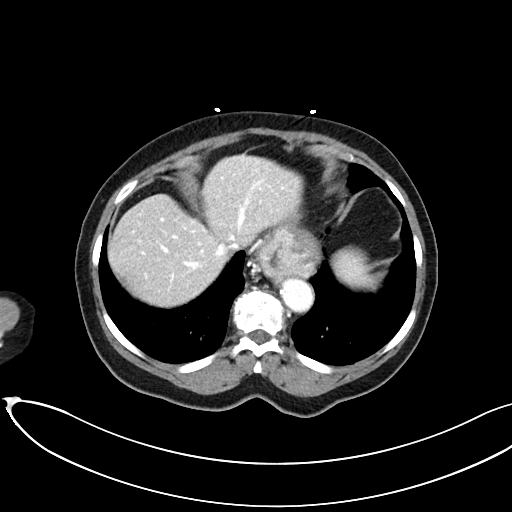
[im 81/88  soft-tissue]
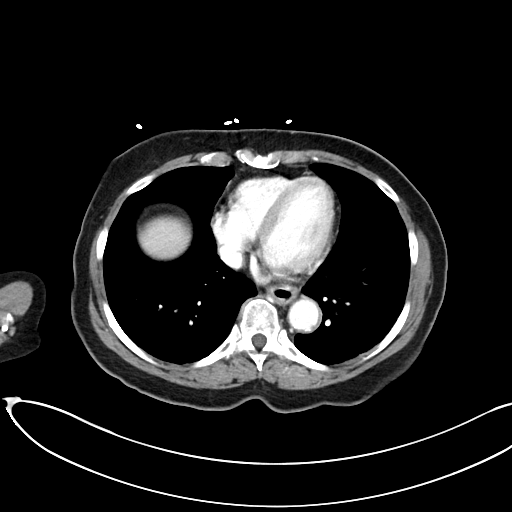

[Series 6: abdomen 3.0 mpr cor · coronal · 0.74mm/px · 3 of 95 slices shown]
[im 32/95  soft-tissue]
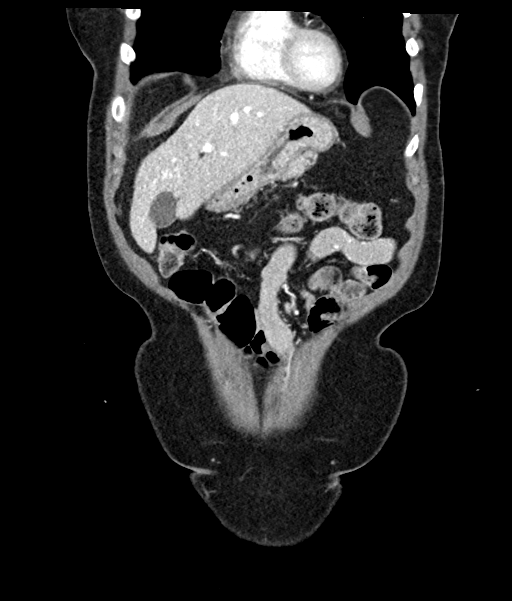
[im 42/95  soft-tissue]
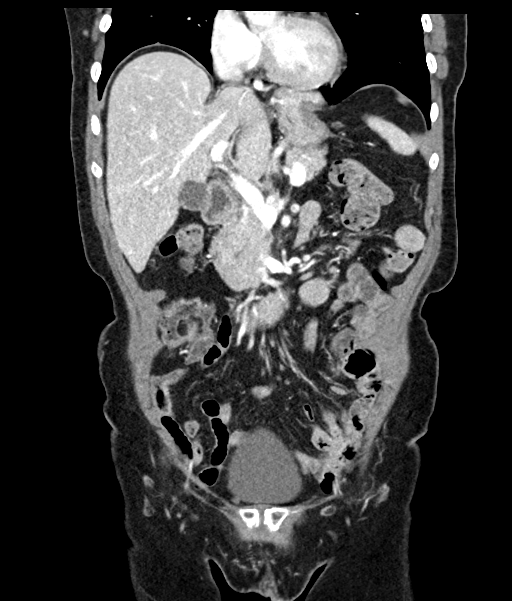
[im 53/95  soft-tissue]
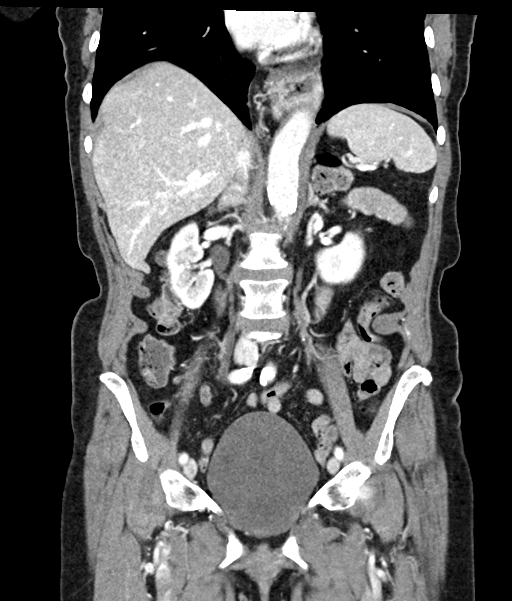

[15 of 46 positions shown; findings below may reference images not displayed]

RADIATION DOSE REDUCTION: This exam was performed according to the
departmental dose-optimization program which includes automated
exposure control, adjustment of the mA and/or kV according to
patient size and/or use of iterative reconstruction technique.

CONTRAST:  100mL OMNIPAQUE IOHEXOL 300 MG/ML  SOLN
FINDINGS: Lower chest: Lung bases are unremarkable. There are dense coronary
artery calcifications and aortic root calcifications.

Hepatobiliary: Smooth liver contours. There is diffuse decreased
density throughout the liver suggesting fatty infiltration. No focal
liver mass is identified. There is a 4 mm calcific density layering
within the gallbladder likely a tiny gallstone. No gallbladder wall
thickening is seen. No intrahepatic or extrahepatic biliary ductal
dilatation.

Pancreas: No mass or inflammatory fat stranding. No pancreatic
ductal dilatation is seen.

Spleen: Normal in size without focal abnormality.

Adrenals/Urinary Tract: There is a fluid density cyst measuring up
to 2.7 cm extending partially exophytically from the inferior aspect
of the interpolar right kidney. The kidneys enhance uniformly and
are symmetric in size without hydronephrosis. No renal stone is
seen. No renal mass is seen. No focal urinary bladder wall
thickening.

Stomach/Bowel: Mild scattered descending and sigmoid colon
diverticulosis without inflammatory changes to indicate acute
diverticulitis. The terminal ileum is unremarkable. The appendix
appears within normal limits (axial series 3 images 55 through 59,
coronal images 45 through 66). No dilated loops of bowel to
indicate bowel obstruction. There is a moderate sliding hiatal
hernia.

Vascular/Lymphatic: No abdominal aortic aneurysm. Moderate
atherosclerotic calcifications. No mesenteric, retroperitoneal, or
pelvic lymphadenopathy.

Reproductive: The uterus is present. There is a left adnexal cyst
measuring up to 3.2 cm.

Other: No abdominal wall hernia or abnormality.No abdominopelvic
ascites. No pneumoperitoneum.

Musculoskeletal: Status post median sternotomy. There is 10 mm grade
2 anterolisthesis of L5 on S1 with osseous fusion of the L5-S1
level. There is osseous fusion of the L5-S1 posterior elements.
Moderate T10-11 disc space narrowing and peripheral osteophytosis.
IMPRESSION: 1. Mild scattered distal colonic diverticulosis without inflammatory
changes to indicate acute diverticulitis.
2. Moderate sliding hiatal hernia.
3. Tiny gallstone. No definite gallbladder wall inflammatory
changes.
4. Grade 2 anterolisthesis of L5 on S1 with L5-S1 fusion.

## 2023-11-28 IMAGING — CT CT ANGIO CHEST
3 of 9 series · 18 of 46 positions shown · IV contrast (APPLIED)
Comparison: None.

CLINICAL DATA: Pulmonary embolism (PE) suspected, positive D-dimer

EXAM:
CT ANGIOGRAPHY CHEST WITH CONTRAST
TECHNIQUE: Multidetector CT imaging of the chest was performed using the
standard protocol during bolus administration of intravenous
contrast. Multiplanar CT image reconstructions and MIPs were
obtained to evaluate the vascular anatomy.

[Series 7: thins · axial · 0.58mm/px · z∈[-177,+3]mm · 8 of 330 slices shown (1 of 2)]
[im 37/330  lung]
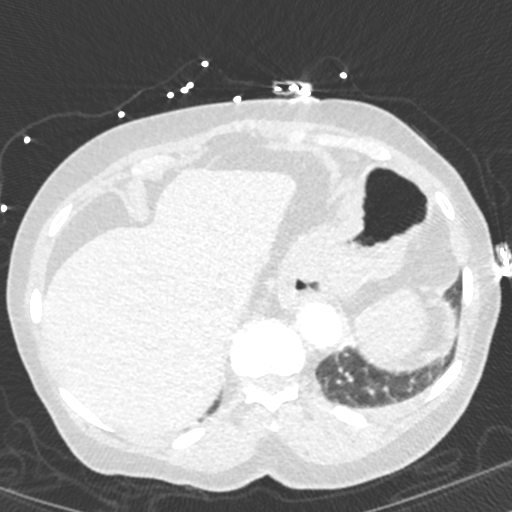
[im 74/330  soft-tissue]
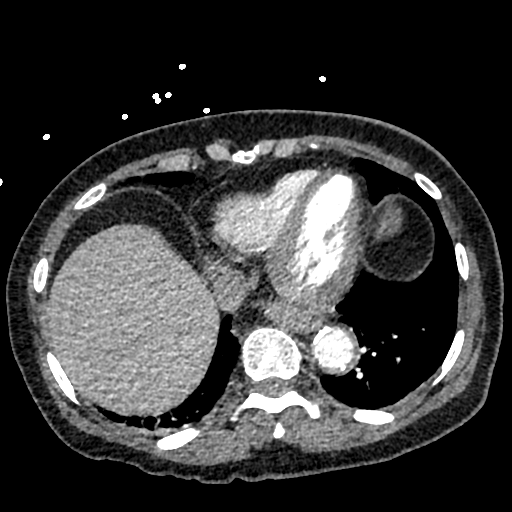
[im 110/330  lung]
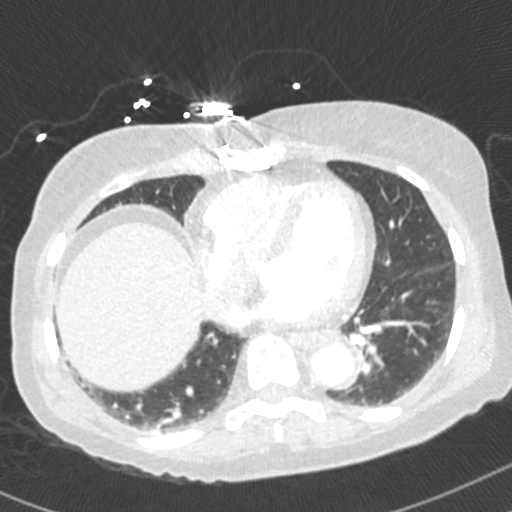
[im 147/330  soft-tissue]
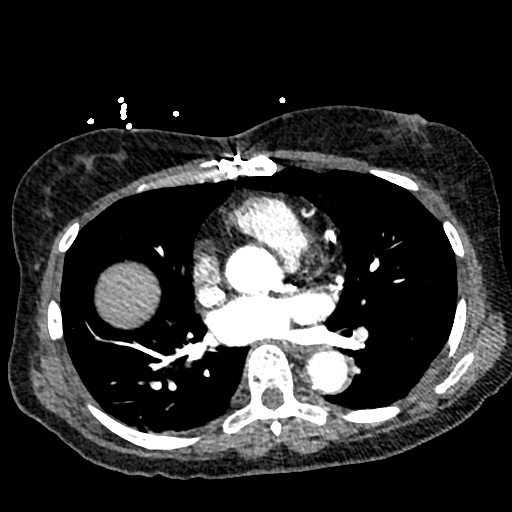
[im 183/330  lung]
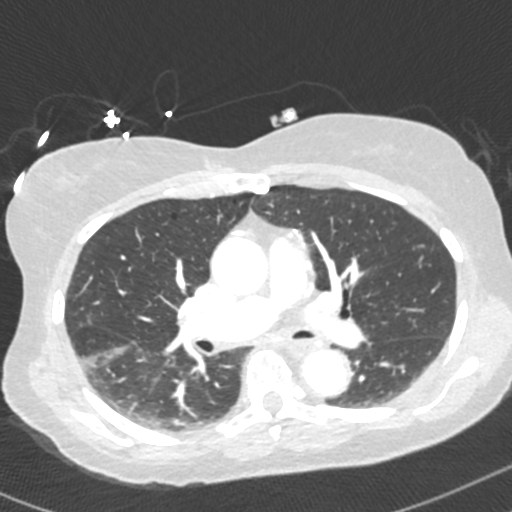
[im 220/330  soft-tissue]
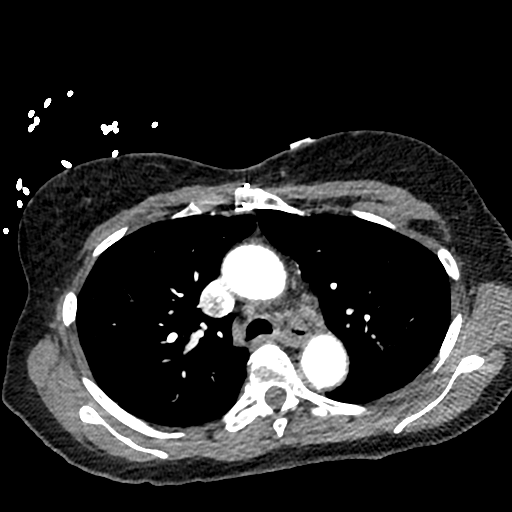
[im 256/330  lung]
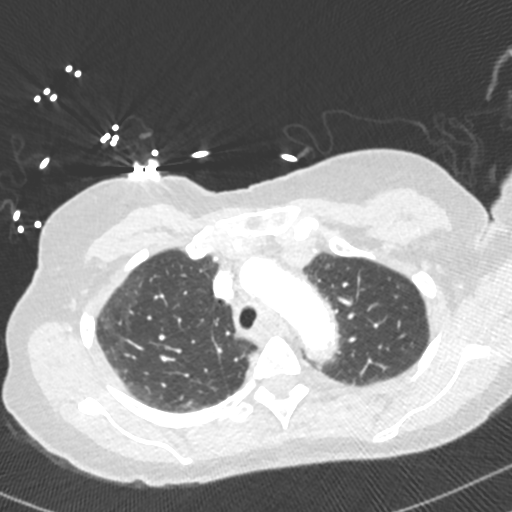
[im 293/330  soft-tissue]
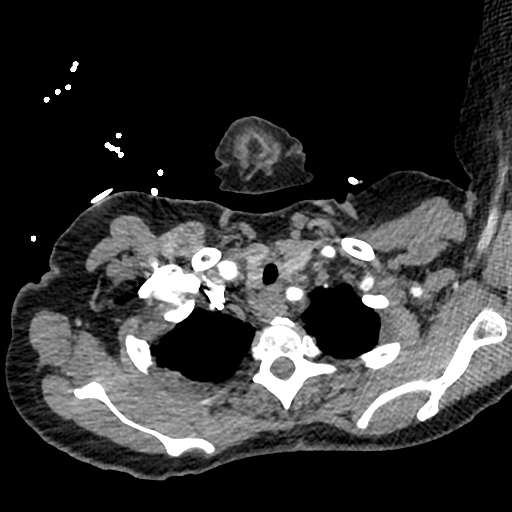

[Series 8: cor · coronal · 0.52mm/px · 3 of 165 slices shown]
[im 42/165  soft-tissue]
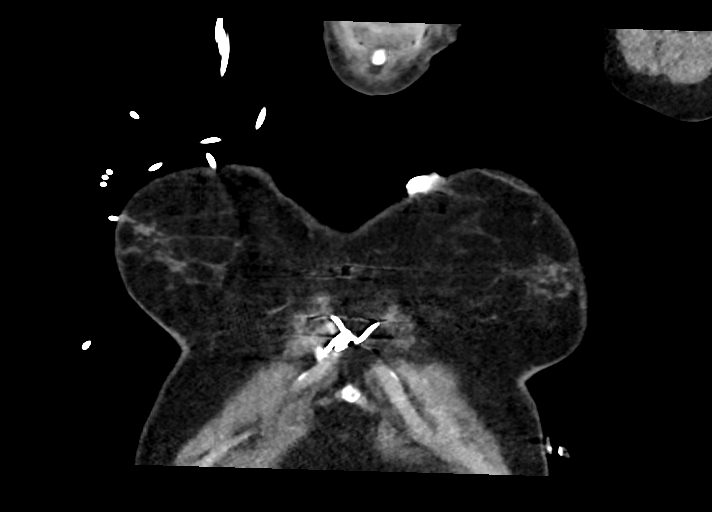
[im 83/165  soft-tissue]
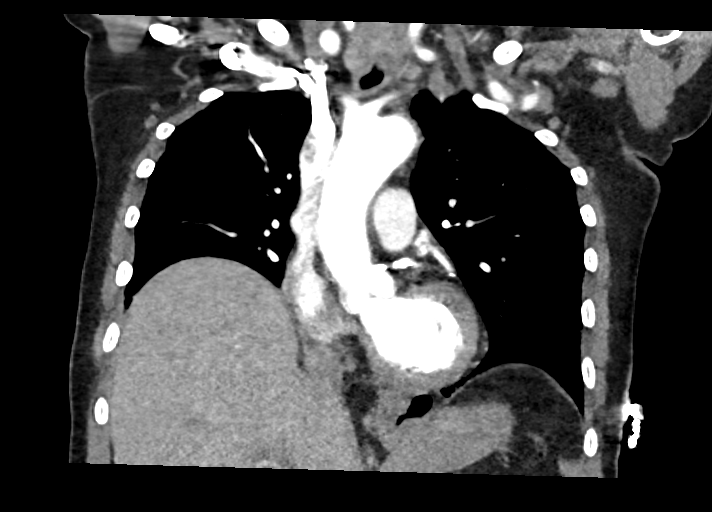
[im 124/165  soft-tissue]
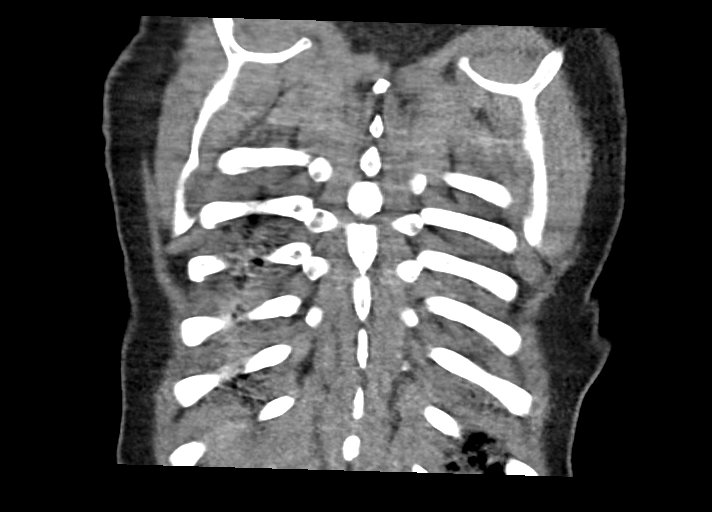

[Series 13: thins · axial · 0.58mm/px · z∈[-177,-23]mm · 7 of 330 slices shown (2 of 2)]
[im 37/330  lung]
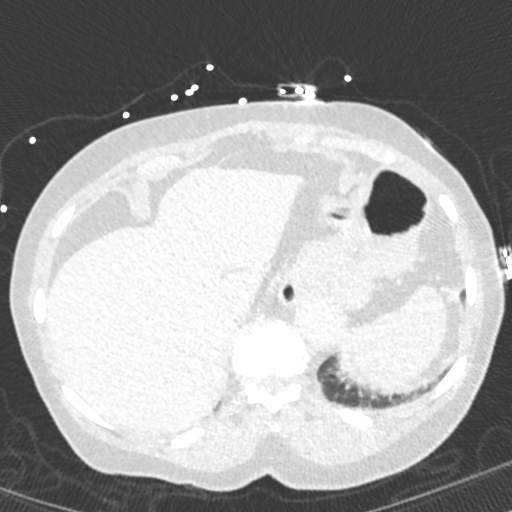
[im 74/330  lung]
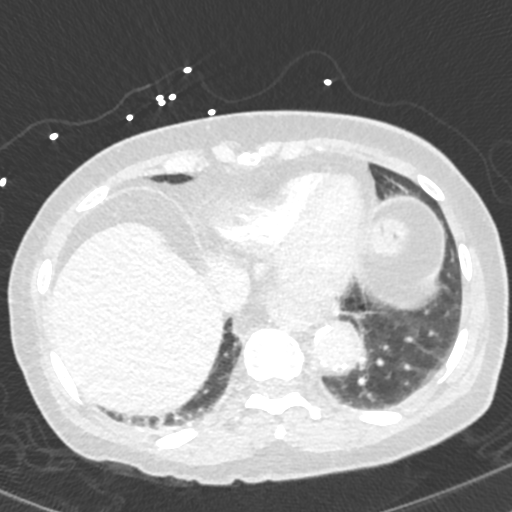
[im 110/330  lung]
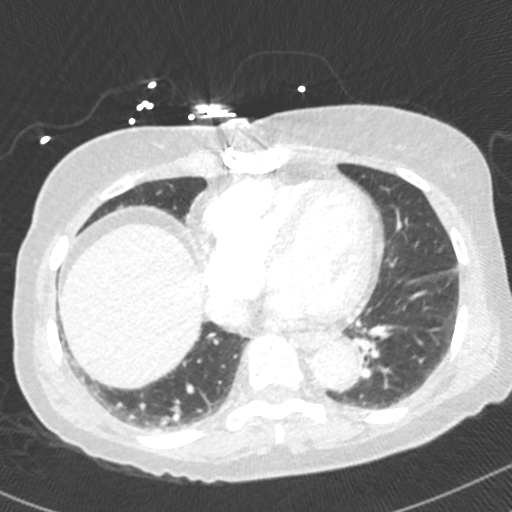
[im 147/330  lung]
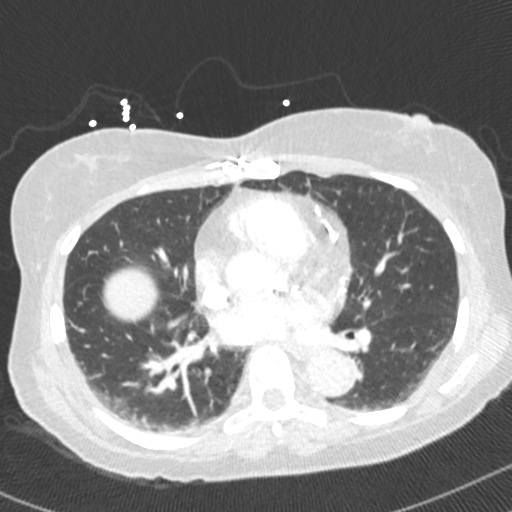
[im 183/330  lung]
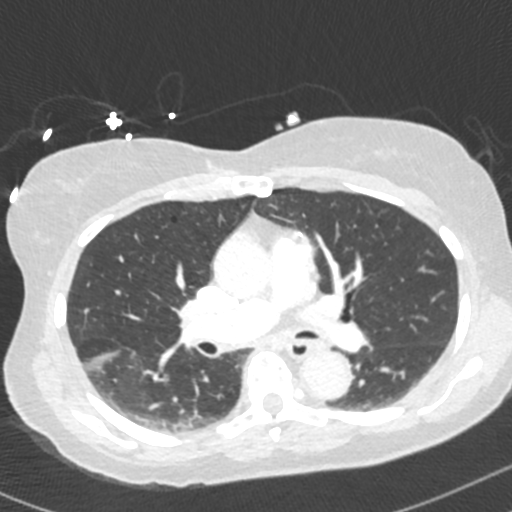
[im 220/330  lung]
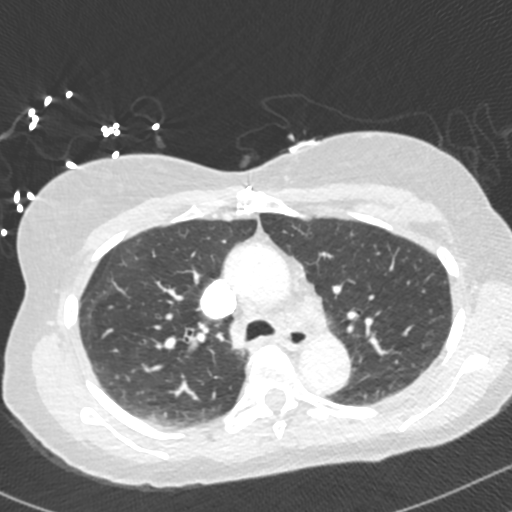
[im 256/330  lung]
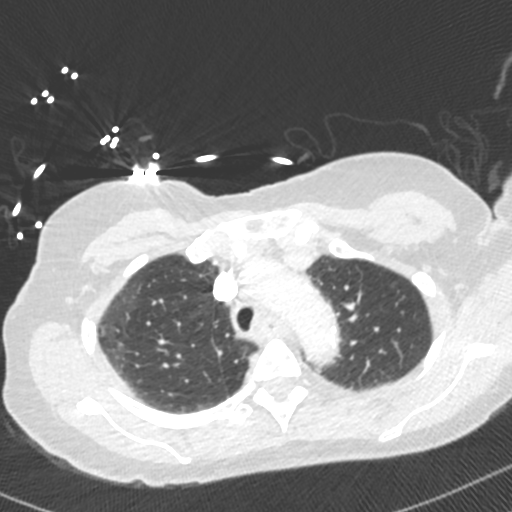

[18 of 46 positions shown; findings below may reference images not displayed]

RADIATION DOSE REDUCTION: This exam was performed according to the
departmental dose-optimization program which includes automated
exposure control, adjustment of the mA and/or kV according to
patient size and/or use of iterative reconstruction technique.

CONTRAST:  50mL OMNIPAQUE IOHEXOL 350 MG/ML SOLN
FINDINGS: Cardiovascular: No filling defects in the pulmonary arteries to
suggest pulmonary emboli. Scattered coronary artery and aortic
calcifications. Heart is normal size. Aorta is normal caliber.

Mediastinum/Nodes: No mediastinal, hilar, or axillary adenopathy.
Trachea and thyroid unremarkable. Areas of the esophagus appear
mildly thickened best seen in the mid and distal esophagus. Small
hiatal hernia.

Lungs/Pleura: No confluent opacities or effusions.

Upper Abdomen: No acute abnormality

Musculoskeletal: Chest wall soft tissues are unremarkable. No acute
bony abnormality.

Review of the MIP images confirms the above findings.
IMPRESSION: No evidence of pulmonary embolus.

No acute cardiopulmonary disease.

Coronary artery disease.

Small hiatal hernia. Mild wall thickening in the mid and distal
esophagus could reflect esophagitis.

Aortic Atherosclerosis ([id number]).

## 2023-12-06 DIAGNOSIS — D509 Iron deficiency anemia, unspecified: Secondary | ICD-10-CM | POA: Diagnosis not present

## 2024-05-18 DIAGNOSIS — I25810 Atherosclerosis of coronary artery bypass graft(s) without angina pectoris: Secondary | ICD-10-CM | POA: Diagnosis not present

## 2024-05-18 DIAGNOSIS — N1831 Chronic kidney disease, stage 3a: Secondary | ICD-10-CM | POA: Diagnosis not present

## 2024-05-18 DIAGNOSIS — I10 Essential (primary) hypertension: Secondary | ICD-10-CM | POA: Diagnosis not present

## 2024-06-18 DIAGNOSIS — I25810 Atherosclerosis of coronary artery bypass graft(s) without angina pectoris: Secondary | ICD-10-CM | POA: Diagnosis not present

## 2024-06-18 DIAGNOSIS — I10 Essential (primary) hypertension: Secondary | ICD-10-CM | POA: Diagnosis not present

## 2024-06-18 DIAGNOSIS — N1831 Chronic kidney disease, stage 3a: Secondary | ICD-10-CM | POA: Diagnosis not present
